# Patient Record
Sex: Male | Born: 1995 | Race: Black or African American | Hispanic: No | Marital: Single | Smoking: Never smoker
Health system: Southern US, Community
[De-identification: ages and names within clinical notes are randomized; demographics above are authoritative.]

---
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
## 2017-04-21 ENCOUNTER — Emergency Department (HOSPITAL_COMMUNITY): Payer: Self-pay

## 2017-04-21 ENCOUNTER — Encounter (HOSPITAL_COMMUNITY): Payer: Self-pay | Admitting: Emergency Medicine

## 2017-04-21 ENCOUNTER — Emergency Department (HOSPITAL_COMMUNITY)
Admission: EM | Admit: 2017-04-21 | Discharge: 2017-04-22 | Disposition: A | Discharge status: Home / Self Care | Payer: Self-pay | Attending: Emergency Medicine | Admitting: Emergency Medicine

## 2017-04-21 DIAGNOSIS — R51 Headache: Secondary | ICD-10-CM | POA: Insufficient documentation

## 2017-04-21 DIAGNOSIS — R519 Headache, unspecified: Secondary | ICD-10-CM

## 2017-04-21 MED ORDER — TETRACAINE HCL 0.5 % OP SOLN
1.0000 [drp] | Freq: Once | OPHTHALMIC | Status: AC
  Administered 2017-04-22: 1 [drp] via OPHTHALMIC
  Filled 2017-04-21: qty 4

## 2017-04-21 NOTE — ED Triage Notes (Signed)
Patient c/o a temporal headache for past 6 months. Denies N/V/D. Patient adds that he has also had night sweats for a while. Pt states in the beginning tylenol and BC powders would help relieve it for a little but are not longer effective. Patient states his gf insisted he come in but nothing acute to why he presented today.

## 2017-04-21 NOTE — ED Provider Notes (Signed)
[facility]-EMERGENCY DEPT Provider Note   CSN: [id number] Arrival date & time: 04/21/17  1544     History   Chief Complaint Chief Complaint  Patient presents with  . Headache    HPI Donald Miles is a 21 y.o. male who presents to the Emergency Department with a chief complaint of chronic daily headaches over the last 6 months. He reports most of his headaches are over the left temporal area, but sometimes are noted to be over the superior global scalp. His significant other reports she became concerned because over the last few months he has developed night sweats, and he has woken up "with the sheets soaked". She reports he initially lost 10 lbs over the last few months when he started working a summer job in [employer]eron[employer], but has since gained the weight back. He states that over the last week his headaches have worsened, and he had nosebleeds for several days over the last week. He reports the nose bleed have since resolved.   He was last seen by an ophthalmologist earlier this year. He reports that he has worn his new glasses prescription daily for the last 6 months, but reports the headaches began prior to when he received his new glasses prescription. He reports mildly increased blurred vision. No diplopia or photophobia.  No dizziness, lightheadedness, otalgia, sinus pain or pressure, chest pain, dyspnea, N/V/D, abdominal pain, fever, or chills. He report a h/o of chronic tinnitus since childhood. No changes recently.   He reports he has treated his symptoms with BC powder and Tylenol, but has not taken a dose within the last week  No pertinent past medical history.  The history is provided by the patient. No language interpreter was used.    History reviewed. No pertinent past medical history.  There are no active problems to display for this patient.   History reviewed. No pertinent surgical history.     Home Medications    Prior to Admission  medications   Medication Sig Start Date End Date Taking? Authorizing Provider  SUMAtriptan (IMITREX) 50 MG tablet Take one tablet of medication for a severe headache. May repeat this dose one time in 2 hours if headache persists or recurs. 04/22/17   Leler Brion A, PA-C    Family History No family history on file.  Social History Social History  Substance Use Topics  . Smoking status: Never Smoker  . Smokeless tobacco: Never Used  . Alcohol use No     Allergies   Patient has no known allergies.   Review of Systems Review of Systems  Constitutional: Negative for chills and fever.  HENT: Negative for ear pain, sinus pain, sinus pressure and sore throat.   Eyes: Positive for photophobia. Negative for pain, discharge, redness and itching.  Respiratory: Negative for cough and shortness of breath.   Cardiovascular: Negative for chest pain.  Gastrointestinal: Negative for abdominal pain, diarrhea, nausea and vomiting.  Neurological: Positive for headaches. Negative for dizziness, weakness, light-headedness and numbness.   Physical Exam Updated Vital Signs BP (!) 120/58 (BP Location: Left Arm)   Pulse (!) 44   Temp 98 F (36.7 C) (Oral)   Resp 16   Ht 6\' 2"  (1.88 m)   Wt 90.7 kg (200 lb)   SpO2 96%   BMI 25.68 kg/m   Physical Exam  Constitutional: He is oriented to person, place, and time. He appears well-developed.  HENT:  Head: Normocephalic.  Right Ear: Hearing and tympanic membrane normal.  Left Ear: Hearing and tympanic membrane normal.  Nose: Nose normal. No epistaxis. Right sinus exhibits no maxillary sinus tenderness and no frontal sinus tenderness. Left sinus exhibits no maxillary sinus tenderness and no frontal sinus tenderness.  Mouth/Throat: Uvula is midline, oropharynx is clear and moist and mucous membranes are normal.  Eyes: Pupils are equal, round, and reactive to light. Conjunctivae and EOM are normal. Right eye exhibits no discharge. Left eye exhibits  no discharge. No scleral icterus.  Fundoscopic exam:      The right eye shows no AV nicking, no exudate and no papilledema.       The left eye shows no AV nicking, no exudate and no papilledema.  Neck: Normal range of motion. Neck supple.  No meningeal signs.   Cardiovascular: Normal rate, regular rhythm and normal heart sounds.  Exam reveals no gallop and no friction rub.   No murmur heard. Pulmonary/Chest: Effort normal and breath sounds normal. No respiratory distress. He has no wheezes. He has no rales.  Abdominal: Soft. Bowel sounds are normal. He exhibits no distension. There is no tenderness. There is no guarding.  Musculoskeletal: Normal range of motion. He exhibits no edema, tenderness or deformity.  Neurological: He is alert and oriented to person, place, and time. No cranial nerve deficit or sensory deficit. He exhibits normal muscle tone. Coordination normal.  Cranial nerves 2-12 intact. Finger-to-nose is normal. 5/5 motor strength of the bilateral upper and lower extremities. Moves all four extremities. Negative Romberg. Ambulatory without difficulty. NVI.    Skin: Skin is warm and dry. Capillary refill takes less than 2 seconds.  Psychiatric: His behavior is normal.  Nursing note and vitals reviewed.    ED Treatments / Results  Labs (all labs ordered are listed, but only abnormal results are displayed) Labs Reviewed - No data to display  EKG  EKG Interpretation None       Radiology Ct Head Wo Contrast  Result Date: 04/21/2017 CLINICAL DATA:  Temporal headaches for 6 months.  Night sweats. EXAM: CT HEAD WITHOUT CONTRAST TECHNIQUE: Contiguous axial images were obtained from the base of the skull through the vertex without intravenous contrast. COMPARISON:  None. FINDINGS: Brain: No evidence of acute infarction, hemorrhage, hydrocephalus, extra-axial collection or mass lesion/mass effect. Vascular: No hyperdense vessel or unexpected calcification. Skull: Normal.  Negative for fracture or focal lesion. Sinuses/Orbits: No acute finding. Other: None. IMPRESSION: No acute intracranial abnormalities. Electronically Signed   By: Burman Nieves M.D.   On: 04/21/2017 23:18    Procedures Procedures (including critical care time)  Medications Ordered in ED Medications  tetracaine (PONTOCAINE) 0.5 % ophthalmic solution 1 drop (1 drop Both Eyes Given 04/22/17 0046)  sodium chloride 0.9 % bolus 500 mL (0 mLs Intravenous Stopped 04/22/17 0240)  ketorolac (TORADOL) 30 MG/ML injection 30 mg (30 mg Intravenous Given 04/22/17 0045)  prochlorperazine (COMPAZINE) injection 10 mg (10 mg Intravenous Given 04/22/17 0045)  diphenhydrAMINE (BENADRYL) injection 25 mg (25 mg Intravenous Given 04/22/17 0045)     Initial Impression / Assessment and Plan / ED Course  I have reviewed the triage vital signs and the nursing notes.  Pertinent labs & imaging results that were available during my care of the patient were reviewed by me and considered in my medical decision making (see chart for details).     Pt HA treated and improved while in ED. CT head unremarkable for [facility] or [facility]. No deficits on physical exam. Doubt meningitis, or temporal arteritis. Pt is afebrile with  no focal neuro deficits, nuchal rigidity, or change in vision. Migraine cocktail given with resolution of the patient's headache. Patient declines IOP check at this time.  He is to follow up with opthalmology to discuss his glasses prescription. Referral for PCP given. Strict return precautions given. NAD. VSS. Pt verbalizes understanding and is agreeable with plan to dc.    Final Clinical Impressions(s) / ED Diagnoses   Final diagnoses:  Bad headache    New Prescriptions Discharge Medication List as of 04/22/2017  1:38 AM    START taking these medications   Details  SUMAtriptan (IMITREX) 50 MG tablet Take one tablet of medication for a severe headache. May repeat this dose one time in 2 hours if  headache persists or recurs., Print         Kaleah Hagemeister A, PA-C 04/22/17 2046    Maia PlanLong, Joshua G, MD 04/23/17 1053

## 2017-04-22 MED ORDER — KETOROLAC TROMETHAMINE 30 MG/ML IJ SOLN
30.0000 mg | Freq: Once | INTRAMUSCULAR | Status: AC
  Administered 2017-04-22: 30 mg via INTRAVENOUS
  Filled 2017-04-22: qty 1

## 2017-04-22 MED ORDER — SUMATRIPTAN SUCCINATE 50 MG PO TABS: ORAL_TABLET | ORAL | 0 refills | Status: AC

## 2017-04-22 MED ORDER — SODIUM CHLORIDE 0.9 % IV BOLUS (SEPSIS)
500.0000 mL | Freq: Once | INTRAVENOUS | Status: AC
  Administered 2017-04-22: 500 mL via INTRAVENOUS

## 2017-04-22 MED ORDER — PROCHLORPERAZINE EDISYLATE 5 MG/ML IJ SOLN
10.0000 mg | Freq: Once | INTRAMUSCULAR | Status: AC
  Administered 2017-04-22: 10 mg via INTRAVENOUS
  Filled 2017-04-22: qty 2

## 2017-04-22 MED ORDER — DIPHENHYDRAMINE HCL 50 MG/ML IJ SOLN
25.0000 mg | Freq: Once | INTRAMUSCULAR | Status: AC
  Administered 2017-04-22: 25 mg via INTRAVENOUS
  Filled 2017-04-22: qty 1

## 2017-04-22 NOTE — Discharge Instructions (Signed)
Please use caution with taking daily over-the-counter medications, such as Goody Powder or NSAIDS (ibuprofen/naproxen) because these can cause sometimes worsening rebound headaches.  Please follow-up with your ophthalmologist to have your vision rechecked since getting your new glasses prescription.  You may take one tablet of Sumatriptan if you feel a headache coming one. If the headache does not resolve in 2 hours, you may take one additional tablet of medication.   If you develop new or worsening symptoms, including neck stiffness, fever, vomiting, or visual changes in addition to your headache, please return to the emergency department for re-evaluation.

## 2018-10-17 IMAGING — CT CT HEAD W/O CM
3 of 4 series · 14 of 47 positions shown, 16 images · non-contrast
Comparison: None.

CLINICAL DATA: Temporal headaches for 6 months.  Night sweats.

EXAM:
CT HEAD WITHOUT CONTRAST
TECHNIQUE: Contiguous axial images were obtained from the base of the skull
through the vertex without intravenous contrast.

[Series 2: head w/o · axial · non-contrast · 0.47mm/px · z∈[-217,-92]mm · 8 of 31 slices shown, 10 images]
[im 3/31  brain]
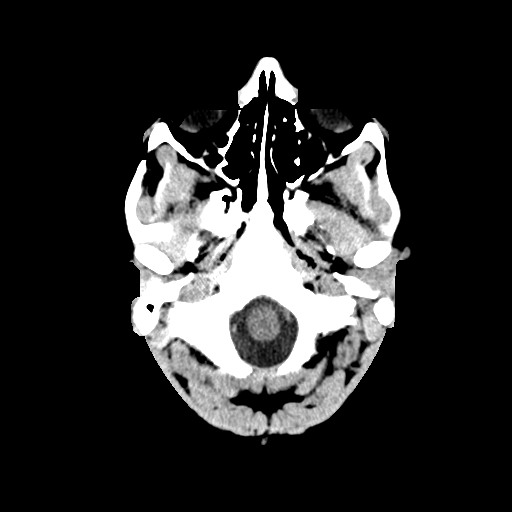
[im 3/31  bone]
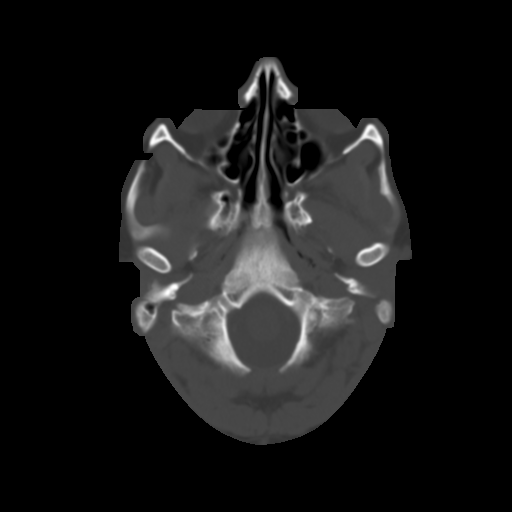
[im 7/31  brain]
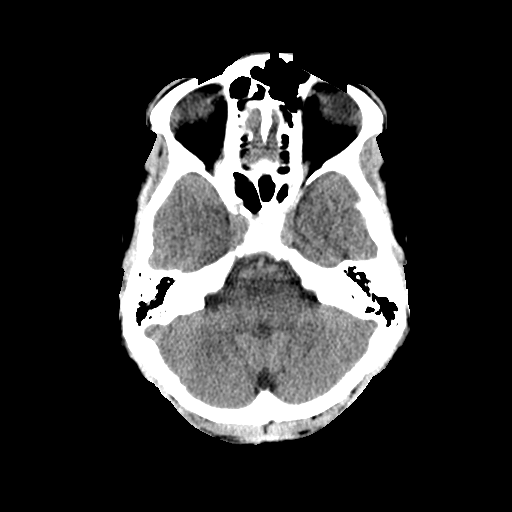
[im 11/31  brain]
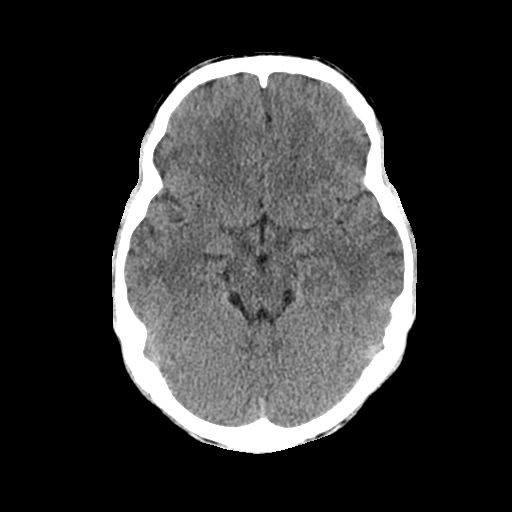
[im 13/31  brain]
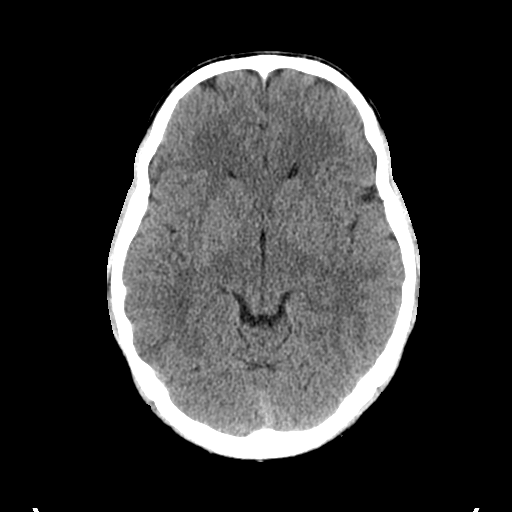
[im 18/31  brain]
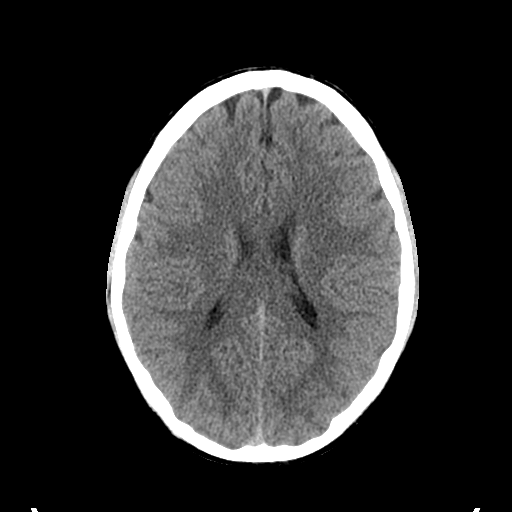
[im 18/31  bone]
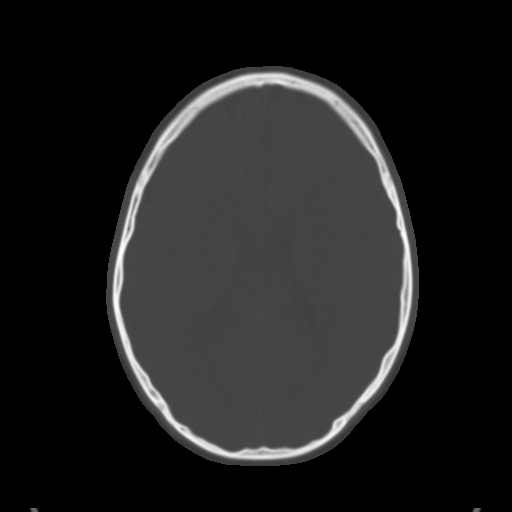
[im 20/31  brain]
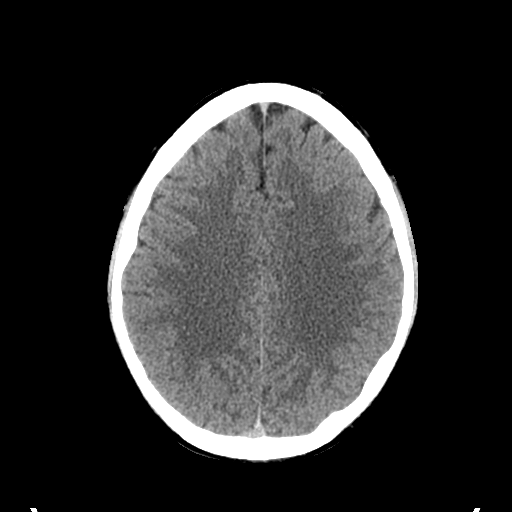
[im 24/31  brain]
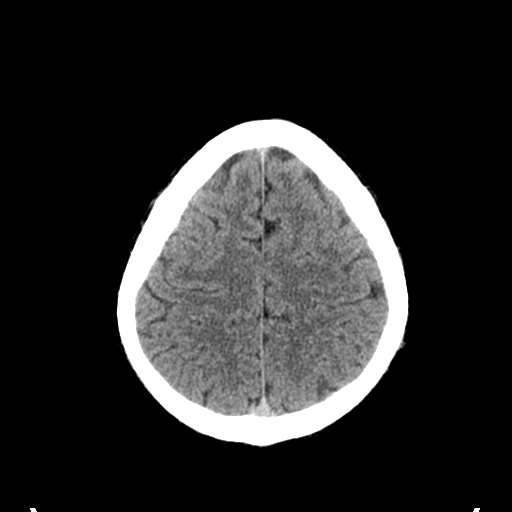
[im 28/31  brain]
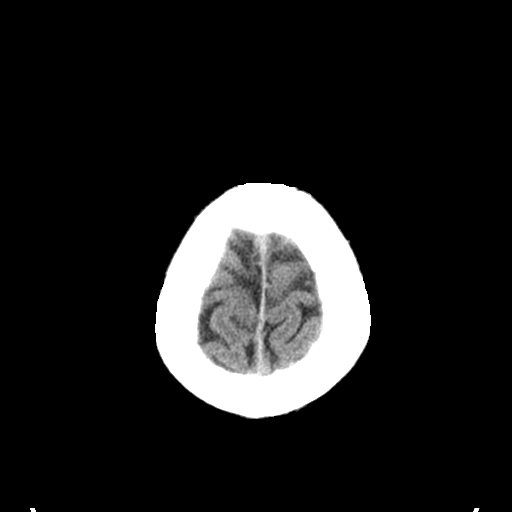

[Series 4: coronal · coronal · 0.30mm/px · 3 of 77 slices shown]
[im 26/77  brain]
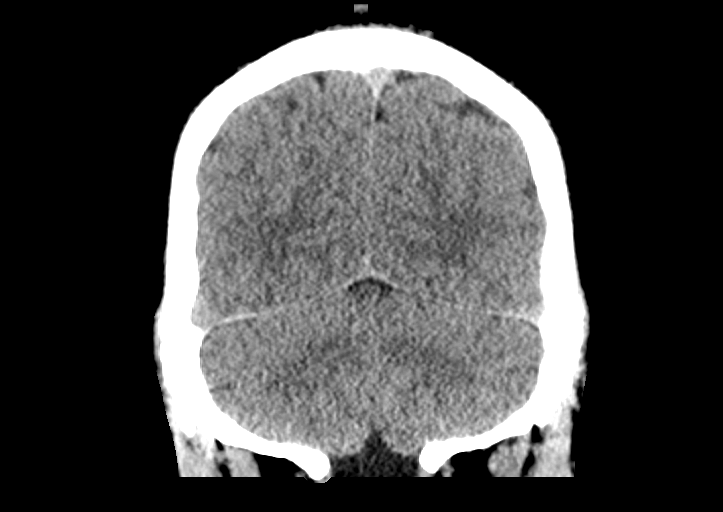
[im 34/77  brain]
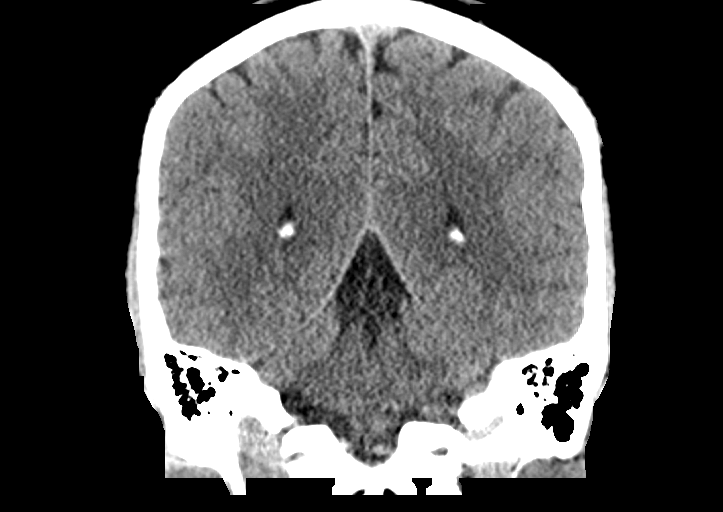
[im 43/77  brain]
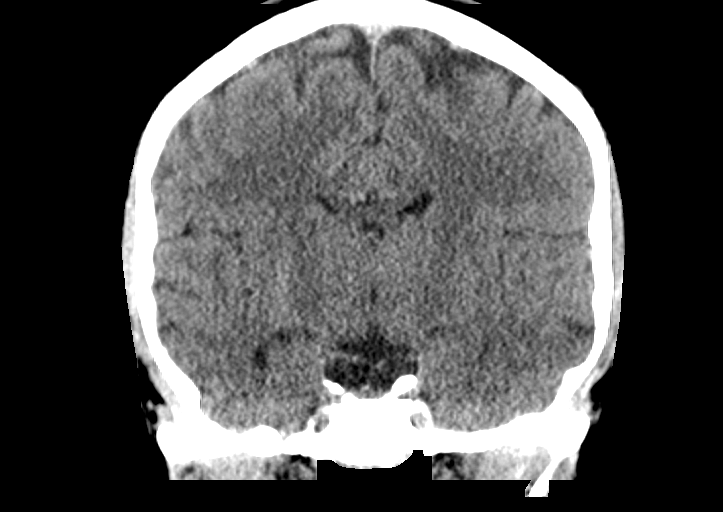

[Series 5: sagittal · sagittal · 0.30mm/px · 3 of 77 slices shown]
[im 26/77  brain]
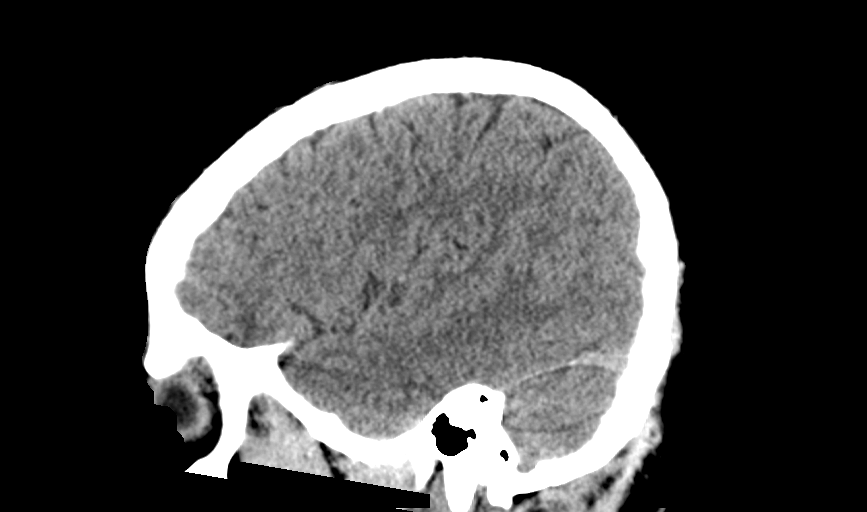
[im 39/77  brain]
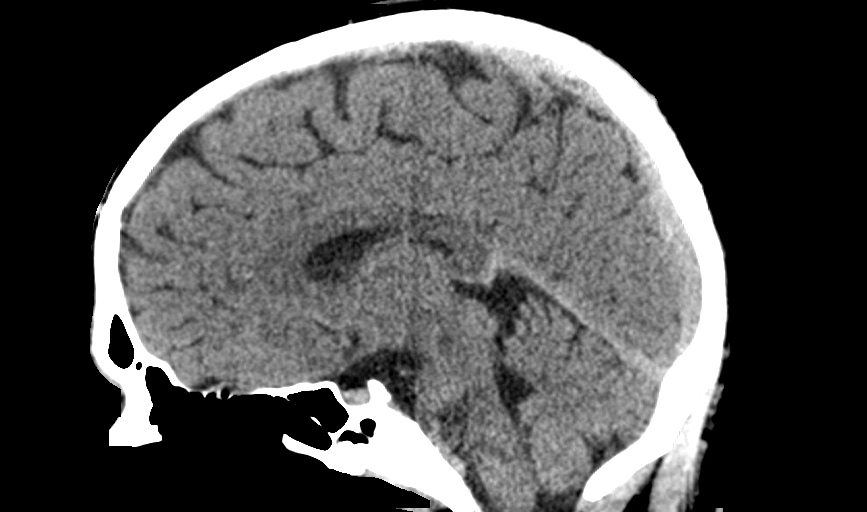
[im 51/77  brain]
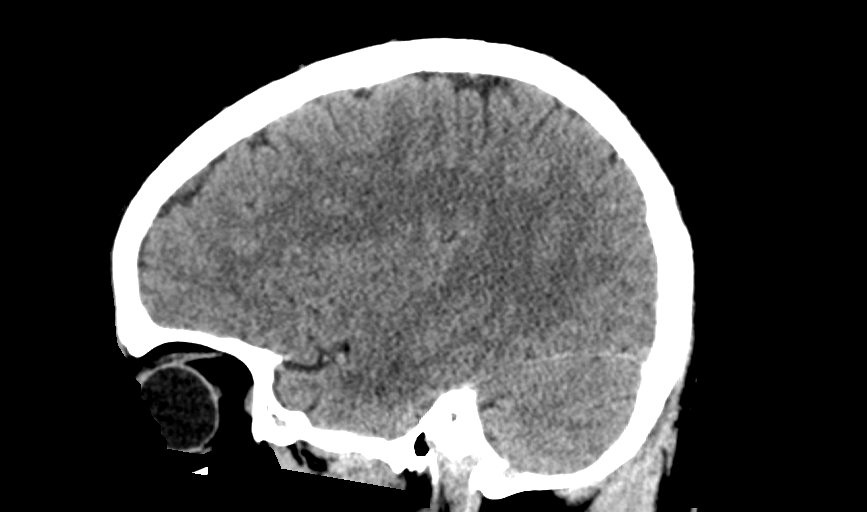

[14 of 47 positions shown; findings below may reference images not displayed]

FINDINGS: Brain: No evidence of acute infarction, hemorrhage, hydrocephalus,
extra-axial collection or mass lesion/mass effect.

Vascular: No hyperdense vessel or unexpected calcification.

Skull: Normal. Negative for fracture or focal lesion.

Sinuses/Orbits: No acute finding.

Other: None.
IMPRESSION: No acute intracranial abnormalities.
